# Patient Record
Sex: Male | Born: 2000 | Race: White | Hispanic: No | Marital: Single | Smoking: Current every day smoker
Health system: Southern US, Community
[De-identification: ages and names within clinical notes are randomized; demographics above are authoritative.]

## PROBLEM LIST (undated history)

## (undated) DIAGNOSIS — J45909 Unspecified asthma, uncomplicated: Secondary | ICD-10-CM

---
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
## 2019-05-17 ENCOUNTER — Other Ambulatory Visit: Payer: Self-pay

## 2019-05-17 ENCOUNTER — Emergency Department: Payer: Medicaid Other

## 2019-05-17 ENCOUNTER — Encounter: Payer: Self-pay | Admitting: Emergency Medicine

## 2019-05-17 ENCOUNTER — Emergency Department
Admission: EM | Admit: 2019-05-17 | Discharge: 2019-05-17 | Disposition: A | Discharge status: Home / Self Care | Payer: Medicaid Other | Attending: Emergency Medicine | Admitting: Emergency Medicine

## 2019-05-17 DIAGNOSIS — M25561 Pain in right knee: Secondary | ICD-10-CM | POA: Diagnosis not present

## 2019-05-17 DIAGNOSIS — M2391 Unspecified internal derangement of right knee: Secondary | ICD-10-CM | POA: Diagnosis not present

## 2019-05-17 MED ORDER — OXYCODONE-ACETAMINOPHEN 5-325 MG PO TABS
2.0000 | ORAL_TABLET | Freq: Once | ORAL | Status: AC
  Administered 2019-05-17: 2 via ORAL
  Filled 2019-05-17: qty 2

## 2019-05-17 MED ORDER — OXYCODONE-ACETAMINOPHEN 5-325 MG PO TABS: 2.0000 | ORAL_TABLET | Freq: Four times a day (QID) | ORAL | 0 refills | Status: DC | PRN

## 2019-05-17 NOTE — ED Provider Notes (Signed)
[facility] Emergency Department Provider Note  ____________________________________________   First MD Initiated Contact with Patient 05/17/19 0505     (approximate)  I have reviewed the triage vital signs and the nursing notes.   HISTORY  Chief Complaint Knee Injury    HPI Kyle Trevino is a 18 y.o. male who denies any chronic medical conditions and denies having any prior traumatic injuries to his legs or knees.  He presents by private vehicle tonight with severe pain in the outside of his right knee.  He says he was lying in bed and sort of had his arms wrapped up around and beneath his knee.  Somehow he twisted his knee and felt a pop and has had exquisite and severe pain since that time.  It is point tender to the outside of the right knee and he feels like he cannot extend it.  He does not believe that the kneecap was out of place.  He assures me that he did not fall or have any sort of traumatic injury, it simply happened because he was twisting his knee with his arm.  He reports the pain is "100 out of 10" and any attempt to move the knee makes it much worse.  He cannot bear weight and he brought with him his own crutches from home.  He did not sustain any other injury.  There is a little bit of swelling but not much.  He has no numbness nor tingling in the leg or foot.         History reviewed. No pertinent past medical history.  There are no active problems to display for this patient.   History reviewed. No pertinent surgical history.  Prior to Admission medications   Medication Sig Start Date End Date Taking? Authorizing Provider  oxyCODONE-acetaminophen (PERCOCET) 5-325 MG tablet Take 2 tablets by mouth every 6 (six) hours as needed for severe pain. 05/17/19   Loleta RoseForbach, Amaria Mundorf, MD    Allergies Patient has no allergy information on record.  No family history on file.  Social History Social History   Tobacco Use  . Smoking status: Not on  file  Substance Use Topics  . Alcohol use: Not on file  . Drug use: Not on file    Review of Systems Constitutional: No fever/chills Respiratory: Denies shortness of breath. Gastrointestinal: No abdominal pain.  No nausea, no vomiting.   Musculoskeletal: Severe pain in right knee on the lateral side of it.  Negative for neck pain.  Negative for back pain. Integumentary: Negative for rash. Neurological: Negative for headaches, focal weakness or numbness.   ____________________________________________   PHYSICAL EXAM:  VITAL SIGNS: ED Triage Vitals  Enc Vitals Group     BP 05/17/19 0048 (!) 162/79     Pulse Rate 05/17/19 0048 84     Resp 05/17/19 0048 18     Temp 05/17/19 0048 98.4 F (36.9 C)     Temp Source 05/17/19 0048 Oral     SpO2 05/17/19 0048 100 %     Weight 05/17/19 0042 90.7 kg (200 lb)     Height 05/17/19 0042 1.778 m (5\' 10" )     Head Circumference --      Peak Flow --      Pain Score 05/17/19 0041 10     Pain Loc --      Pain Edu? --      Excl. in GC? --     Constitutional: Alert and oriented. Well appearing  and in no acute distress but does appear to be in pain. Eyes: Conjunctivae are normal.  Head: Atraumatic. Cardiovascular: Normal rate, regular rhythm. Good peripheral circulation. Respiratory: Normal respiratory effort.   Musculoskeletal: No appreciable swelling of the right knee compared to the left.  There may be a little bit of an effusion on the lateral aspect of the superior part of the knee, but that part is not tender.  He has severe point tenderness to the lateral inferior aspect of the knee joint.  No ecchymosis, no cellulitis or erythema, soft and easily compressible compartments above and below.  No tenderness to palpation of the distal femur or of the proximal tibia.  The patient will not allow extension of the knee; he refuses to extend it actively and he will not allow me to passively extend it more than a little bit.  The patella is in place  and appears normal.  There are no palpable gross deformities. Neurologic:  Normal speech and language. No gross focal neurologic deficits are appreciated.  Skin:  Skin is warm, dry and intact. No rash noted.   ____________________________________________   LABS (all labs ordered are listed, but only abnormal results are displayed)  Labs Reviewed - No data to display ____________________________________________  EKG  None - EKG not ordered by ED physician ____________________________________________  RADIOLOGY I, Hinda Kehr, personally viewed and evaluated these images (plain radiographs) as part of my medical decision making, as well as reviewing the written report by the radiologist.  ED MD interpretation: No fracture or dislocation with no obvious effusions.  Official radiology report(s): Dg Knee 2 Views Right  Result Date: 05/17/2019 CLINICAL DATA:  Right knee pain, unable to bear weight. Unable to extend knee. EXAM: RIGHT KNEE - 1-2 VIEW COMPARISON:  None. FINDINGS: No acute fracture. Patella appears normally located on AP and lateral views. Joint spaces are maintained. No large knee joint effusion. No bony destructive change. IMPRESSION: No fracture or evidence of dislocation. Electronically Signed   By: Keith Rake M.D.   On: 05/17/2019 01:46    ____________________________________________   PROCEDURES   Procedure(s) performed (including Critical Care):  Procedures   ____________________________________________   INITIAL IMPRESSION / MDM / [place] / ED COURSE  As part of my medical decision making, I reviewed the following data within the [place] notes reviewed and incorporated, Radiograph reviewed , Notes from prior ED visits and [place] Controlled Substance Database   Probable internal derangement of the knee, most likely a meniscal tear or lateral collateral ligament tear or sprain.  No evidence of emergent medical  condition at this time and no indication for MRI in the ED.  I try to convince the patient to extend his knee so we can put him in a knee immobilizer but he is unable to do so secondary to the pain.  As an alternative I provided an Ace wrap and went over RICE recommendations, over-the-counter pain medicine, Percocet if absolutely necessary, nonweightbearing status, and close orthopedics follow-up.  He says he understands and agrees.  I have no concerns at this time for knee dislocation and there is no evidence that he has dislocated his patella.  He has very specific point tenderness that seems to be arising from 1 of the lateral ligaments.          ____________________________________________  FINAL CLINICAL IMPRESSION(S) / ED DIAGNOSES  Final diagnoses:  Acute pain of right knee  Internal derangement of right knee  MEDICATIONS GIVEN DURING THIS VISIT:  Medications  oxyCODONE-acetaminophen (PERCOCET/ROXICET) 5-325 MG per tablet 2 tablet (2 tablets Oral Given 05/17/19 0529)     ED Discharge Orders         Ordered    oxyCODONE-acetaminophen (PERCOCET) 5-325 MG tablet  Every 6 hours PRN     05/17/19 0531          *Please note:  Kyle Trevino was evaluated in Emergency Department on 05/17/2019 for the symptoms described in the history of present illness. He was evaluated in the context of the global COVID-19 pandemic, which necessitated consideration that the patient might be at risk for infection with the SARS-CoV-2 virus that causes COVID-19. Institutional protocols and algorithms that pertain to the evaluation of patients at risk for COVID-19 are in a state of rapid change based on information released by regulatory bodies including the CDC and federal and state organizations. These policies and algorithms were followed during the patient's care in the ED.  Some ED evaluations and interventions may be delayed as a result of limited staffing during the pandemic.*  Note:  This  document was prepared using Dragon voice recognition software and may include unintentional dictation errors.   Loleta RoseForbach, Cristen Murcia, MD 05/17/19 [phone]

## 2019-05-17 NOTE — Discharge Instructions (Signed)
As we discussed, your x-rays were reassuring with no evidence of a fracture or dislocation in the knee joint.  However, based on the pain you are experiencing, it is likely that you have an internal injury, such as a torn meniscus or a tear or sprain of the lateral collateral ligament of the right knee.  The initial treatment is RICE (Rest, Ice, Compression, Elevation), as well as over-the-counter medications such as ibuprofen and Tylenol according to label instructions.  Please refer to the included information.  Please do not bear weight on your leg and use the Ace wrap provided and your crutches as much as possible.  Call the office of Dr. Mack Guise first thing in the morning and schedule a follow-up appointment.  Explain you were seen in the emergency department for a knee injury and need a follow-up appointment and they will schedule you the next available appointment.  Return to the emergency department with new or worsening symptoms that concern you.  Take Percocet as prescribed for severe pain. Do not drink alcohol, drive or participate in any other potentially dangerous activities while taking this medication as it may make you sleepy. Do not take this medication with any other sedating medications, either prescription or over-the-counter. If you were prescribed Percocet or Vicodin, do not take these with acetaminophen (Tylenol) as it is already contained within these medications.   This medication is an opiate (or narcotic) pain medication and can be habit forming.  Use it as little as possible to achieve adequate pain control.  Do not use or use it with extreme caution if you have a history of opiate abuse or dependence.  If you are on a pain contract with your primary care doctor or a pain specialist, be sure to let them know you were prescribed this medication today from the [facility] Emergency Department.  This medication is intended for your use only - do not give any to anyone else and  keep it in a secure place where nobody else, especially children, have access to it.  It will also cause or worsen constipation, so you may want to consider taking an over-the-counter stool softener while you are taking this medication.

## 2019-05-17 NOTE — ED Notes (Signed)
Pt father name Osker, phone number [phone].

## 2019-05-17 NOTE — ED Triage Notes (Signed)
Pt states that he thinks he popped his right knee out of place. Pt states he had his knee up and moved it a certain way and that was it, the pain started.

## 2021-06-22 ENCOUNTER — Emergency Department: Payer: Medicaid Other

## 2021-06-22 ENCOUNTER — Emergency Department
Admission: EM | Admit: 2021-06-22 | Discharge: 2021-06-22 | Disposition: A | Discharge status: Home / Self Care | Payer: Medicaid Other | Attending: Emergency Medicine | Admitting: Emergency Medicine

## 2021-06-22 ENCOUNTER — Other Ambulatory Visit: Payer: Self-pay

## 2021-06-22 DIAGNOSIS — R042 Hemoptysis: Secondary | ICD-10-CM | POA: Diagnosis not present

## 2021-06-22 DIAGNOSIS — R0602 Shortness of breath: Secondary | ICD-10-CM | POA: Insufficient documentation

## 2021-06-22 DIAGNOSIS — R0789 Other chest pain: Secondary | ICD-10-CM | POA: Insufficient documentation

## 2021-06-22 LAB — BASIC METABOLIC PANEL
Anion gap: 7 (ref 5–15)
BUN: 8 mg/dL (ref 6–20)
CO2: 27 mmol/L (ref 22–32)
Calcium: 9.5 mg/dL (ref 8.9–10.3)
Chloride: 103 mmol/L (ref 98–111)
Creatinine, Ser: 0.64 mg/dL (ref 0.61–1.24)
GFR, Estimated: >60 mL/min (ref >60)
Glucose, Bld: 91 mg/dL (ref 70–99)
Potassium: 4.1 mmol/L (ref 3.5–5.1)
Sodium: 137 mmol/L (ref 135–145)

## 2021-06-22 LAB — TROPONIN I (HIGH SENSITIVITY)
Troponin I (High Sensitivity): 5 ng/L (ref <18)
Troponin I (High Sensitivity): 3 ng/L (ref <18)

## 2021-06-22 LAB — CBC
HCT: 46.4 % (ref 39.0–52.0)
Hemoglobin: 16.1 g/dL (ref 13.0–17.0)
MCH: 30 pg (ref 26.0–34.0)
MCHC: 34.7 g/dL (ref 30.0–36.0)
MCV: 86.6 fL (ref 80.0–100.0)
Platelets: 229 10*3/uL (ref 150–400)
RBC: 5.36 MIL/uL (ref 4.22–5.81)
RDW: 13.2 % (ref 11.5–15.5)
WBC: 7.2 10*3/uL (ref 4.0–10.5)
nRBC: 0 % (ref 0.0–0.2)

## 2021-06-22 MED ORDER — IOHEXOL 350 MG/ML SOLN
75.0000 mL | Freq: Once | INTRAVENOUS | Status: AC | PRN
  Administered 2021-06-22: 75 mL via INTRAVENOUS

## 2021-06-22 MED ORDER — PREDNISONE 10 MG PO TABS: 10.0000 mg | ORAL_TABLET | Freq: Every day | ORAL | 0 refills | Status: DC

## 2021-06-22 NOTE — ED Provider Notes (Signed)
[facility] Emergency Department Provider Note  Time seen: 9:31 PM  I have reviewed the triage vital signs and the nursing notes.   HISTORY  Chief Complaint Hemoptysis, Shortness of Breath, and Chest Pain   HPI Kyle Trevino is a 20 y.o. male with no past medical history presents to the emergency department for hemoptysis.  Patient states for the past 3 to 4 months he has been coughing up small amounts of blood.  States tonight he coughed up a larger amount of bloody sputum so he came to the emergency department for evaluation.  Patient states the cough is chronic.  Denies any chest pain.  Denies any shortness of breath.   History reviewed. No pertinent past medical history.  There are no problems to display for this patient.   History reviewed. No pertinent surgical history.  Prior to Admission medications   Medication Sig Start Date End Date Taking? Authorizing Provider  oxyCODONE-acetaminophen (PERCOCET) 5-325 MG tablet Take 2 tablets by mouth every 6 (six) hours as needed for severe pain. 05/17/19   Loleta Rose, MD    Not on File  History reviewed. No pertinent family history.  Social History Social History   Tobacco Use   Smoking status: Never   Smokeless tobacco: Never    Review of Systems Constitutional: Negative for fever. Cardiovascular: Negative for chest pain. Respiratory: Negative for shortness of breath.  Positive for cough.  Positive for hemoptysis x3 to 4 months. Gastrointestinal: Negative for abdominal pain, vomiting  Musculoskeletal: Negative for musculoskeletal complaints Neurological: Negative for headache All other ROS negative  ____________________________________________   PHYSICAL EXAM:  VITAL SIGNS: ED Triage Vitals  Enc Vitals Group     BP 06/22/21 1755 132/81     Pulse Rate 06/22/21 1753 63     Resp 06/22/21 1753 18     Temp 06/22/21 1753 98.5 F (36.9 C)     Temp Source 06/22/21 1753 Oral     SpO2  06/22/21 1753 97 %     Weight 06/22/21 1755 185 lb (83.9 kg)     Height 06/22/21 1755 5\' 10"  (1.778 m)     Head Circumference --      Peak Flow --      Pain Score 06/22/21 1754 2     Pain Loc --      Pain Edu? --      Excl. in GC? --    Constitutional: Alert and oriented. Well appearing and in no distress. Eyes: Normal exam ENT      Head: Normocephalic and atraumatic.      Mouth/Throat: Mucous membranes are moist. Cardiovascular: Normal rate, regular rhythm.  Respiratory: Normal respiratory effort without tachypnea nor retractions. Breath sounds are clear  Gastrointestinal: Soft and nontender. No distention.   Musculoskeletal: Nontender with normal range of motion in all extremities. Neurologic:  Normal speech and language. No gross focal neurologic deficits  Skin:  Skin is warm, dry and intact.  Psychiatric: Mood and affect are normal.   ____________________________________________ RADIOLOGY  Chest x-ray shows no acute abnormality.  ____________________________________________   INITIAL IMPRESSION / ASSESSMENT AND PLAN / ED COURSE  Pertinent labs & imaging results that were available during my care of the patient were reviewed by me and considered in my medical decision making (see chart for details).   Patient presents emergency department for intermittent hemoptysis x3 to 4 months.  Currently the patient appears well, no distress.  States the cough is longstanding.  Denies any fever.  Denies any chest pain.  Denies feeling short of breath.  Patient's work-up thus far is reassuring including lab work, negative troponin.  Normal white blood cell count.  Normal H&H.  Chest x-ray shows no acute findings.  However given the patient's 3 to 4 months of hemoptysis we will obtain a CTA of the chest to further evaluate.  Patient agreeable to plan of care.  CT scan is negative for acute abnormality.  No PE.  Patient appears well.  Repeat troponin is negative.  We will discharge the  patient with prednisone taper for the next 10 days.  Patient agreeable to plan of care.  Kyle Trevino was evaluated in Emergency Department on 06/22/2021 for the symptoms described in the history of present illness. He was evaluated in the context of the global COVID-19 pandemic, which necessitated consideration that the patient might be at risk for infection with the SARS-CoV-2 virus that causes COVID-19. Institutional protocols and algorithms that pertain to the evaluation of patients at risk for COVID-19 are in a state of rapid change based on information released by regulatory bodies including the CDC and federal and state organizations. These policies and algorithms were followed during the patient's care in the ED.  ____________________________________________   FINAL CLINICAL IMPRESSION(S) / ED DIAGNOSES  Hemoptysis   Minna Antis, MD 06/22/21 2229

## 2021-06-22 NOTE — Discharge Instructions (Signed)
Please take your steroids for the entire course.  Please follow-up with your primary care doctor for recheck/reevaluation.  Return to the emergency department for any increase in the amount of bloody sputum, any shortness of breath, chest pain or any other symptom personally concerning to yourself.

## 2021-06-22 NOTE — ED Triage Notes (Signed)
PT c/o blood in cough, CP w/ anxiety, and SOB x2 months. Pt states blood has been scant but this morning the blood was more than usual. Pt is AOX4, NAD noted. Breathing is even and unlabored.

## 2022-04-15 ENCOUNTER — Encounter: Payer: Self-pay | Admitting: Emergency Medicine

## 2022-04-15 ENCOUNTER — Other Ambulatory Visit: Payer: Self-pay

## 2022-04-15 ENCOUNTER — Emergency Department
Admission: EM | Admit: 2022-04-15 | Discharge: 2022-04-16 | Disposition: A | Discharge status: Home / Self Care | Payer: Medicaid Other | Attending: Emergency Medicine | Admitting: Emergency Medicine

## 2022-04-15 DIAGNOSIS — J45909 Unspecified asthma, uncomplicated: Secondary | ICD-10-CM | POA: Diagnosis not present

## 2022-04-15 DIAGNOSIS — K0889 Other specified disorders of teeth and supporting structures: Secondary | ICD-10-CM | POA: Diagnosis not present

## 2022-04-15 HISTORY — DX: Unspecified asthma, uncomplicated: J45.909

## 2022-04-15 NOTE — ED Triage Notes (Signed)
Patient ambulatory to triage with steady gait, without difficulty or distress noted; pt reports left sided upper and lower dental pain x 3-4 days

## 2022-04-16 MED ORDER — LIDOCAINE VISCOUS HCL 2 % MT SOLN
15.0000 mL | Freq: Once | OROMUCOSAL | Status: AC
  Administered 2022-04-16: 15 mL via OROMUCOSAL
  Filled 2022-04-16: qty 15

## 2022-04-16 MED ORDER — IBUPROFEN 800 MG PO TABS
800.0000 mg | ORAL_TABLET | Freq: Once | ORAL | Status: AC
  Administered 2022-04-16: 800 mg via ORAL
  Filled 2022-04-16: qty 1

## 2022-04-16 MED ORDER — IBUPROFEN 800 MG PO TABS: 800.0000 mg | ORAL_TABLET | Freq: Three times a day (TID) | ORAL | 0 refills | Status: DC | PRN

## 2022-04-16 MED ORDER — OXYCODONE-ACETAMINOPHEN 5-325 MG PO TABS
1.0000 | ORAL_TABLET | Freq: Once | ORAL | Status: AC
  Administered 2022-04-16: 1 via ORAL
  Filled 2022-04-16: qty 1

## 2022-04-16 MED ORDER — LIDOCAINE VISCOUS HCL 2 % MT SOLN: 15.0000 mL | OROMUCOSAL | 0 refills | Status: DC | PRN

## 2022-04-16 NOTE — ED Provider Notes (Signed)
[facility] Provider Note    Event Date/Time   First MD Initiated Contact with Patient 04/16/22 [phone]     (approximate)   History   Dental Pain   HPI  Kyle Trevino is a 21 y.o. male significant past medical history who presents for evaluation of dental pain.  Patient reports 3 to 4 days of progressively worsening pain.  Has been taking ibuprofen and using some benzocaine topical gel.  This evening he could not sleep due to the severity of the pain.  He does have an appointment with his dentist in a couple of days.  He denies trismus or fever     Past Medical History:  Diagnosis Date   Asthma     History reviewed. No pertinent surgical history.   Physical Exam   Triage Vital Signs: ED Triage Vitals  Enc Vitals Group     BP 04/16/22 0002 (!) 144/94     Pulse Rate 04/16/22 0002 71     Resp 04/16/22 0002 19     Temp 04/16/22 0002 98.4 F (36.9 C)     Temp Source 04/16/22 0002 Oral     SpO2 04/16/22 0002 97 %     Weight 04/15/22 2348 200 lb (90.7 kg)     Height 04/15/22 2348 5\' 9"  (1.753 m)     Head Circumference --      Peak Flow --      Pain Score 04/15/22 2347 10     Pain Loc --      Pain Edu? --      Excl. in GC? --     Most recent vital signs: Vitals:   04/16/22 0002  BP: (!) 144/94  Pulse: 71  Resp: 19  Temp: 98.4 F (36.9 C)  SpO2: 97%     Constitutional: Alert and oriented. Well appearing and in no apparent distress. HEENT:      Head: Normocephalic and atraumatic.         Eyes: Conjunctivae are normal. Sclera is non-icteric.       Mouth/Throat: Mucous membranes are moist. L lower wisdom tooth is coming through, no cavities, good dentition otherwise.  Floor of the mouth is soft, no trismus      Neck: Supple with no signs of meningismus. Cardiovascular: Regular rate and rhythm.  Respiratory: Normal respiratory effort.  Neurologic: Normal speech and language. Face is symmetric. Moving all extremities. No gross focal  neurologic deficits are appreciated. Skin: Skin is warm, dry and intact. No rash noted. Psychiatric: Mood and affect are normal. Speech and behavior are normal.  ED Results / Procedures / Treatments   Labs (all labs ordered are listed, but only abnormal results are displayed) Labs Reviewed - No data to display   EKG  none   RADIOLOGY none   PROCEDURES:  Critical Care performed: No  Procedures    IMPRESSION / MDM / ASSESSMENT AND PLAN / ED COURSE  I reviewed the triage vital signs and the nursing notes.  21 y.o. male significant past medical history who presents for evaluation of dental pain.  This pain is coming from his left lower wisdom tooth which is coming out. No cavities, no abscess, no Ludewig's angina.  Will prescribe 800 mg of ibuprofen and topical viscous lidocaine.  We will give him one-time dose of Percocet here for pain.  Recommended follow-up with his dentist  MEDICATIONS GIVEN IN ED: Medications  lidocaine (XYLOCAINE) 2 % viscous mouth solution 15 mL (has no administration  in time range)  ibuprofen (ADVIL) tablet 800 mg (has no administration in time range)  oxyCODONE-acetaminophen (PERCOCET/ROXICET) 5-325 MG per tablet 1 tablet (has no administration in time range)    EMR reviewed I reviewed patient's [place] controlled database with a score of 0.  No other records available.    FINAL CLINICAL IMPRESSION(S) / ED DIAGNOSES   Final diagnoses:  Pain, dental     Rx / DC Orders   ED Discharge Orders          Ordered    ibuprofen (ADVIL) 800 MG tablet  Every 8 hours PRN        04/16/22 0048    lidocaine (XYLOCAINE) 2 % solution  As needed        04/16/22 0048             Note:  This document was prepared using Dragon voice recognition software and may include unintentional dictation errors.   Please note:  Patient was evaluated in Emergency Department today for the symptoms described in the history of present illness. Patient was evaluated  in the context of the global COVID-19 pandemic, which necessitated consideration that the patient might be at risk for infection with the SARS-CoV-2 virus that causes COVID-19. Institutional protocols and algorithms that pertain to the evaluation of patients at risk for COVID-19 are in a state of rapid change based on information released by regulatory bodies including the CDC and federal and state organizations. These policies and algorithms were followed during the patient's care in the ED.  Some ED evaluations and interventions may be delayed as a result of limited staffing during the pandemic.       Don Perking, [place], MD 04/16/22 [phone]

## 2022-04-16 NOTE — ED Notes (Signed)
Patient stated that he has had this left sided dental pain for the past few days that is radiating into upper face. Patient believes he still has wisdom teeth and has a dentist appt in a couple days

## 2022-04-16 NOTE — Discharge Instructions (Signed)
OPTIONS FOR DENTAL FOLLOW UP CARE ° °[place] [facility] and [facility] - Local Safety Net Dental Clinics °http://www.ncdhhs.gov/dph/oralhealth/services/safetynetclinics.htm °  °[facility] (336-562-3123) ° °[facility] [place] (919-933-9087) ° °[facility] (919-663-1744 ext [phone]) ° °[facility] ([phone]) ° °[facility] ([phone]) °This clinic caters to the indigent population and is on a lottery system. °Location: °[facility], [address], [place] °Clinic Hours: °Wednesdays from 6pm - 9pm, patients seen by a lottery system. °For dates, call or go to www.med.[facility].edu/[facility]/patients/Dental-[facility] °Services: °Cleanings, fillings and simple extractions. °Payment Options: °DENTAL WORK IS FREE OF CHARGE. Bring proof of income or support. °Best way to get seen: °Arrive at 5:15 pm - this is a lottery, NOT first come/first serve, so arriving earlier will not increase your chances of being seen. °  °  °[facility] °919-537-3737 °Select option 1 for emergencies °  °Location: °[facility], [address], [place] °Clinic Hours: °No walk-ins accepted - call the day before to schedule an appointment. °Check in times are 9:30 am and 1:30 pm. °Services: °Simple extractions, temporary fillings, pulpectomy/pulp debridement, uncomplicated abscess drainage. °Payment Options: °PAYMENT IS DUE AT THE TIME OF SERVICE.  Fee is usually $100-200, additional surgical procedures (e.g. abscess drainage) may be extra. °Cash, checks, Visa/MasterCard accepted.  Can file Medicaid if patient is covered for dental - patient should call case worker to check. °No discount for [facility] patients. °Best way to get seen: °MUST call the day before and get onto the schedule. Can usually be seen the next 1-2 days. No walk-ins accepted. °  °  °[facility] °[phone] °   °Location: °[facility], [address], [place] °Clinic Hours: °M, W, Th, F 8am or 1:30pm, Tues 9a or 1:30 - first come/first served. °Services: °Simple extractions, temporary fillings, uncomplicated abscess drainage.  You do not need to be an [place] resident. °Payment Options: °PAYMENT IS DUE AT THE TIME OF SERVICE. °Dental insurance, otherwise sliding scale - bring proof of income or support. °Depending on income and treatment needed, cost is usually $50-200. °Best way to get seen: °Arrive early as it is first come/first served. °  °  °[facility] °[phone] °  °Location: °[address] °Clinic Hours: °Mon-Thu 8a-5p °Services: °Most basic dental services including extractions and fillings. °Payment Options: °PAYMENT IS DUE AT THE TIME OF SERVICE. °Sliding scale, up to 50% off - bring proof if income or support. °Medicaid with dental option accepted. °Best way to get seen: °Call to schedule an appointment, can usually be seen within 2 weeks OR they will try to see walk-ins - show up at 8a or 2p (you may have to wait). °  °  °[facility] °[phone] °[place] RESIDENTS ONLY °  °Location: °[facility], [address], [place], [place] [address] °Clinic Hours: By appointment only. °Monday - Thursday 8am-5pm, Friday 8am-12pm °Services: Cleanings, fillings, extractions. °Payment Options: °PAYMENT IS DUE AT THE TIME OF SERVICE. °Cash, Visa or MasterCard. Sliding scale - $30 minimum per service. °Best way to get seen: °Come in to office, complete packet and make an appointment - need proof of income °or support monies for each household member and proof of [place] residence. °Usually takes about a month to get in. °  °  °[facility] °[phone] °  °Location: °[address]   [place] °Clinic Hours: Walk-in Urgent Care Dental Services are offered Monday-Friday  mornings only. °The numbers of emergencies accepted daily is limited to the number of °providers available. °Maximum 15 - Mondays, Wednesdays & Thursdays °Maximum 10 - Tuesdays & Fridays °Services: °You do not need to be a [place] resident to be seen for a dental emergency. °Emergencies are defined as pain, swelling, abnormal bleeding, or dental trauma. Walkins will receive x-rays if needed. °NOTE: Dental cleaning is not an emergency. °Payment Options: °PAYMENT IS DUE AT THE TIME OF SERVICE. °Minimum co-pay is $40.00 for uninsured patients. °Minimum co-pay is $3.00 for Medicaid with dental coverage. °Dental Insurance is accepted and must be presented at time of visit. °Medicare does not cover dental. °Forms of payment: Cash, credit card, checks. °Best way to get seen: °If not previously registered with the clinic, walk-in dental registration begins at 7:15 am and is on a first come/first serve basis. °If previously registered with the clinic, call to make an appointment. °  °  °[facility] °[phone] °[place] RESIDENTS ONLY °  °Location: °[address], [place], [place] °Clinic Hours: °Mon-Thu 10a-2p °Services: Extractions only! °Payment Options: °FREE (donations accepted) - bring proof of income or support °Best way to get seen: °Call and schedule an appointment OR come at 8am on the 1st Monday of every month (except for holidays) when it is first come/first served. °  °  °[facility] °[phone] °  °Location: °[address], [place] °Clinic Hours: °Friday mornings °Services, Payment Options, Best way to get seen: °Call for info °

## 2022-12-31 IMAGING — CT CT ANGIO CHEST
2 of 6 series · 18 of 46 positions shown · IV contrast (APPLIED)
Comparison: None.

CLINICAL DATA: Hemoptysis.

EXAM:
CT ANGIOGRAPHY CHEST WITH CONTRAST
TECHNIQUE: Multidetector CT imaging of the chest was performed using the
standard protocol during bolus administration of intravenous
contrast. Multiplanar CT image reconstructions and MIPs were
obtained to evaluate the vascular anatomy.
CONTRAST:  75mL OMNIPAQUE IOHEXOL 350 MG/ML SOLN

[Series 5: thins · axial · 0.76mm/px · z∈[-57,+215]mm · 15 of 371 slices shown]
[im 16/371  lung]
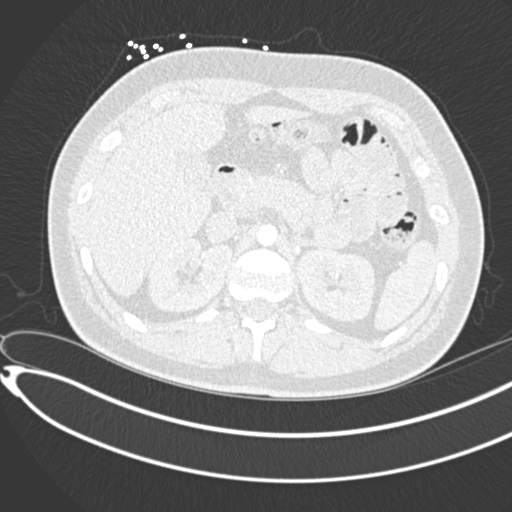
[im 47/371  soft-tissue]
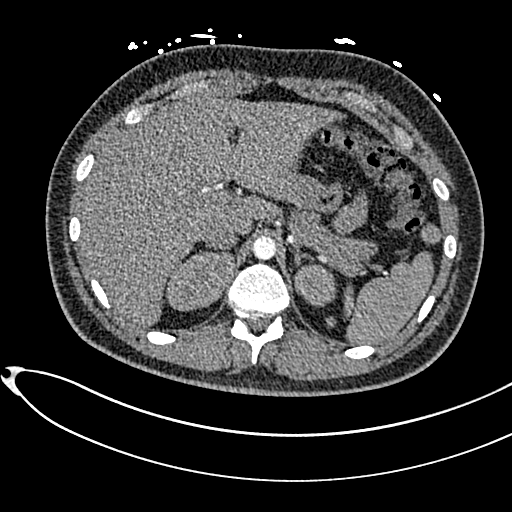
[im 62/371  lung]
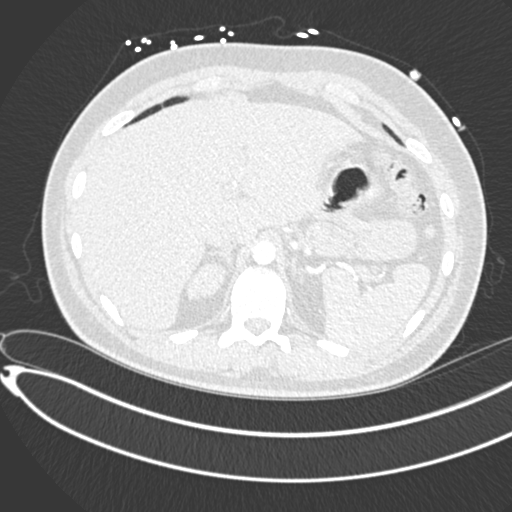
[im 93/371  soft-tissue]
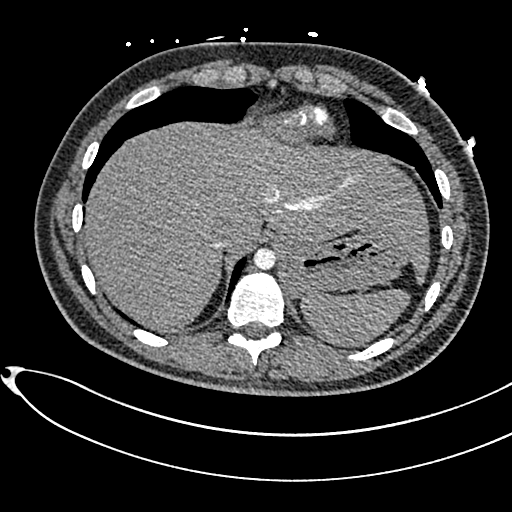
[im 108/371  lung]
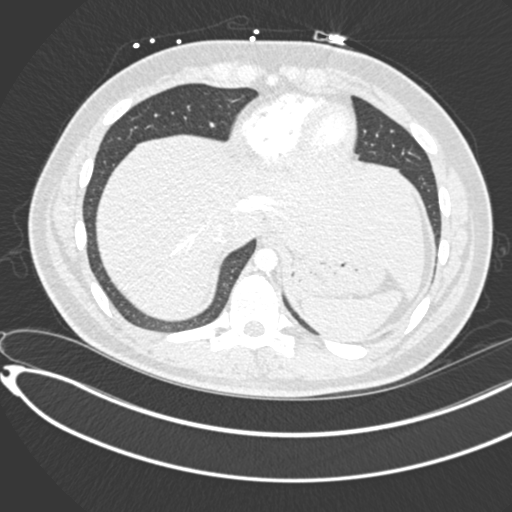
[im 139/371  soft-tissue]
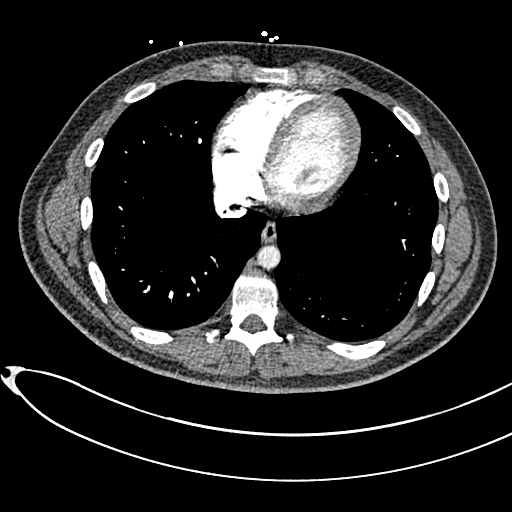
[im 155/371  lung]
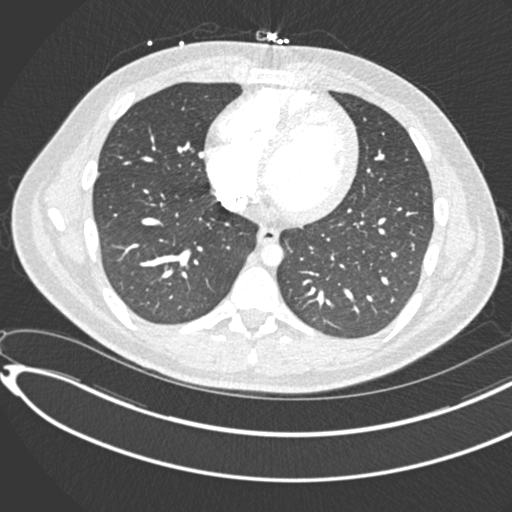
[im 186/371  soft-tissue]
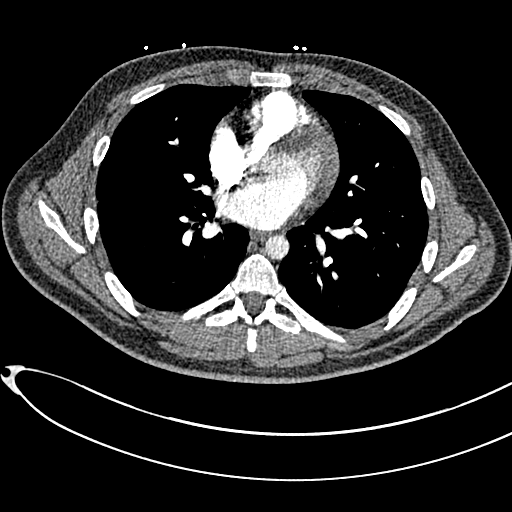
[im 216/371  lung]
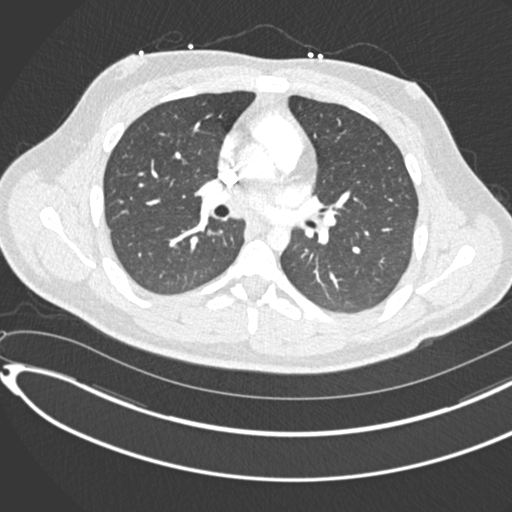
[im 232/371  soft-tissue]
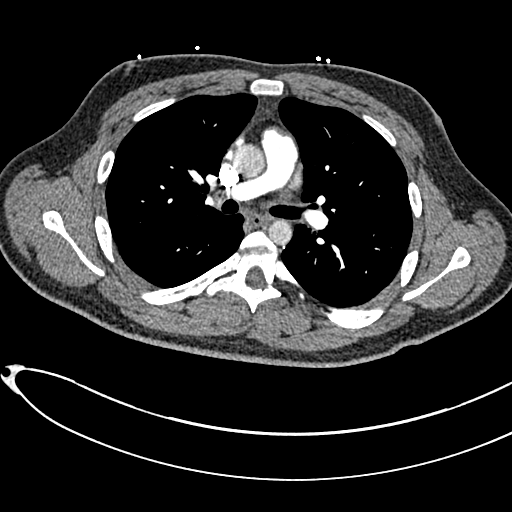
[im 263/371  lung]
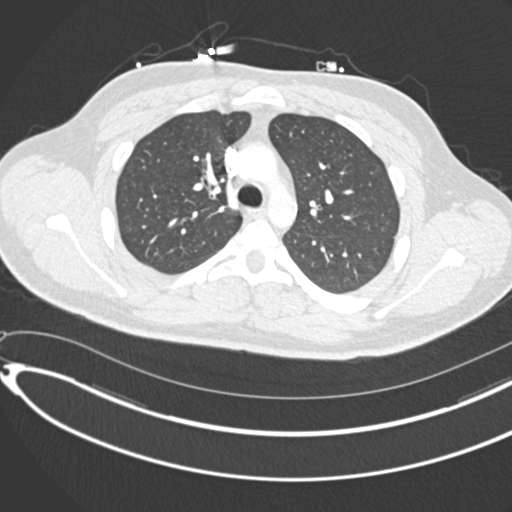
[im 278/371  soft-tissue]
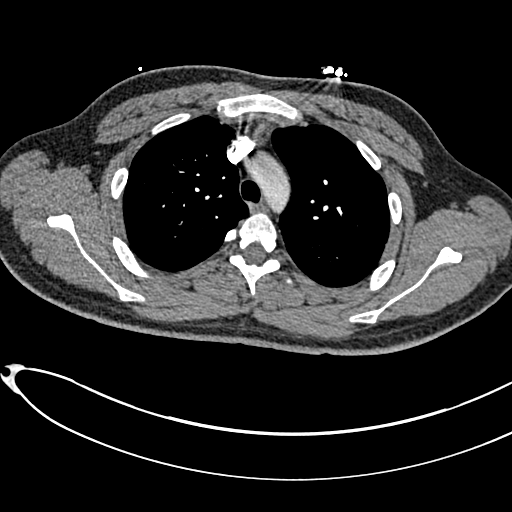
[im 309/371  lung]
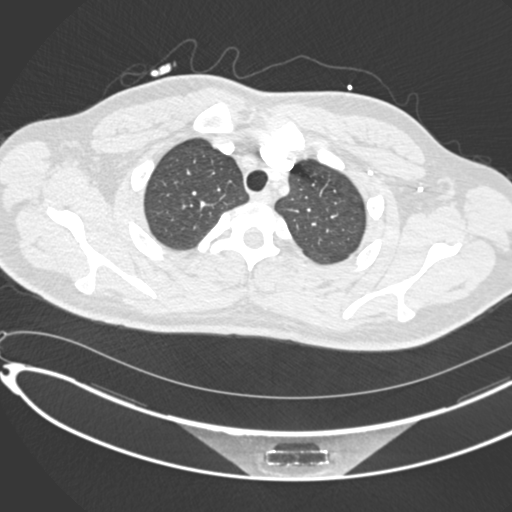
[im 324/371  soft-tissue]
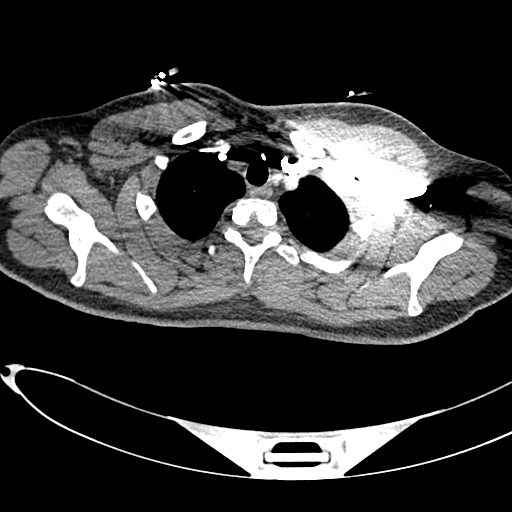
[im 355/371  lung]
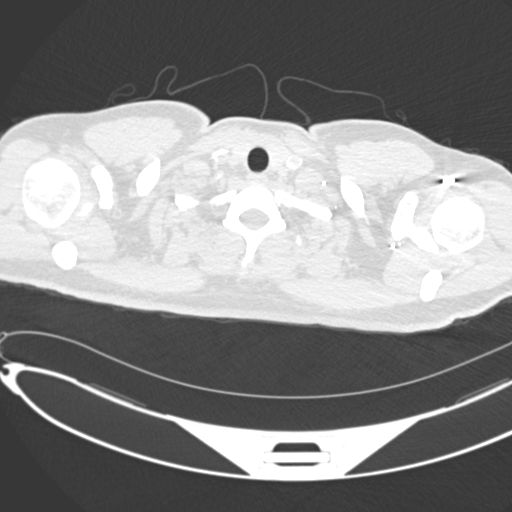

[Series 7: coronal mpr · coronal · 0.58mm/px · 3 of 104 slices shown]
[im 26/104  soft-tissue]
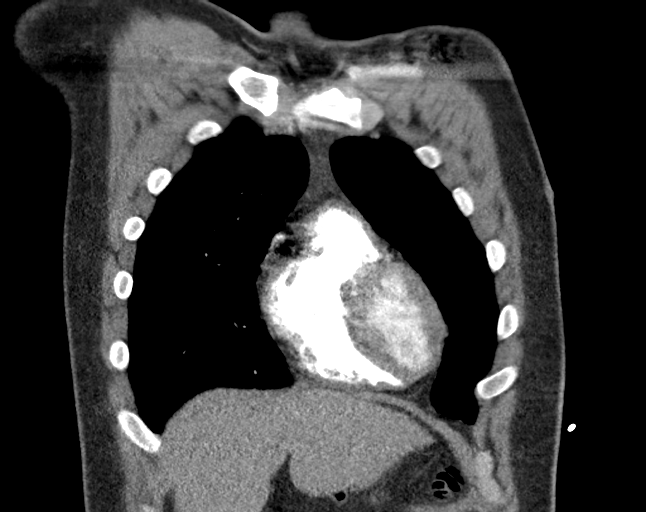
[im 52/104  soft-tissue]
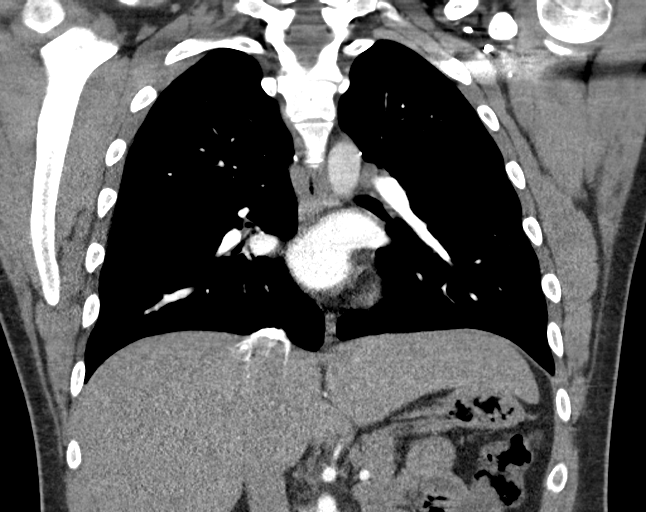
[im 78/104  soft-tissue]
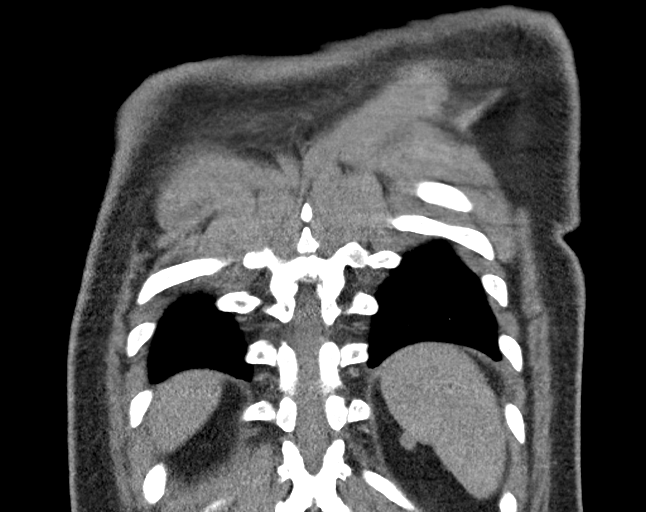

[18 of 46 positions shown; findings below may reference images not displayed]

FINDINGS: Cardiovascular: Satisfactory opacification of the pulmonary arteries
to the segmental level. No evidence of pulmonary embolism. Normal
heart size. No pericardial effusion.

Mediastinum/Nodes: No enlarged mediastinal, hilar, or axillary lymph
nodes. Thyroid gland, trachea, and esophagus demonstrate no
significant findings.

Lungs/Pleura: Lungs are clear. No pleural effusion or pneumothorax.

Upper Abdomen: No acute abnormality.

Musculoskeletal: No chest wall abnormality. No acute or significant
osseous findings.

Review of the MIP images confirms the above findings.
IMPRESSION: Negative examination for pulmonary embolism or acute cardiopulmonary
disease.

## 2023-03-12 ENCOUNTER — Other Ambulatory Visit: Payer: Self-pay

## 2023-03-12 ENCOUNTER — Emergency Department
Admission: EM | Admit: 2023-03-12 | Discharge: 2023-03-12 | Disposition: A | Discharge status: Home / Self Care | Payer: Medicaid Other | Attending: Emergency Medicine | Admitting: Emergency Medicine

## 2023-03-12 ENCOUNTER — Emergency Department: Payer: Medicaid Other

## 2023-03-12 DIAGNOSIS — R1031 Right lower quadrant pain: Secondary | ICD-10-CM | POA: Diagnosis present

## 2023-03-12 DIAGNOSIS — R001 Bradycardia, unspecified: Secondary | ICD-10-CM | POA: Diagnosis not present

## 2023-03-12 LAB — URINALYSIS, ROUTINE W REFLEX MICROSCOPIC
Bilirubin Urine: NEGATIVE
Glucose, UA: NEGATIVE mg/dL
Hgb urine dipstick: NEGATIVE
Ketones, ur: NEGATIVE mg/dL
Leukocytes,Ua: NEGATIVE
Nitrite: NEGATIVE
Protein, ur: NEGATIVE mg/dL
Specific Gravity, Urine: 1.009 (ref 1.005–1.030)
pH: 6 (ref 5.0–8.0)

## 2023-03-12 LAB — COMPREHENSIVE METABOLIC PANEL
ALT: 18 U/L (ref 0–44)
AST: 19 U/L (ref 15–41)
Albumin: 4.6 g/dL (ref 3.5–5.0)
Alkaline Phosphatase: 66 U/L (ref 38–126)
Anion gap: 9 (ref 5–15)
BUN: 11 mg/dL (ref 6–20)
CO2: 27 mmol/L (ref 22–32)
Calcium: 9.4 mg/dL (ref 8.9–10.3)
Chloride: 102 mmol/L (ref 98–111)
Creatinine, Ser: 0.81 mg/dL (ref 0.61–1.24)
GFR, Estimated: >60 mL/min (ref >60)
Glucose, Bld: 120 mg/dL — ABNORMAL HIGH (ref 70–99)
Potassium: 3.6 mmol/L (ref 3.5–5.1)
Sodium: 138 mmol/L (ref 135–145)
Total Bilirubin: 0.6 mg/dL (ref 0.3–1.2)
Total Protein: 7.4 g/dL (ref 6.5–8.1)

## 2023-03-12 LAB — CBC
HCT: 44.9 % (ref 39.0–52.0)
Hemoglobin: 15.2 g/dL (ref 13.0–17.0)
MCH: 30.4 pg (ref 26.0–34.0)
MCHC: 33.9 g/dL (ref 30.0–36.0)
MCV: 89.8 fL (ref 80.0–100.0)
Platelets: 243 10*3/uL (ref 150–400)
RBC: 5 MIL/uL (ref 4.22–5.81)
RDW: 13.2 % (ref 11.5–15.5)
WBC: 8 10*3/uL (ref 4.0–10.5)
nRBC: 0 % (ref 0.0–0.2)

## 2023-03-12 LAB — LIPASE, BLOOD: Lipase: 33 U/L (ref 11–51)

## 2023-03-12 MED ORDER — IOHEXOL 300 MG/ML  SOLN
100.0000 mL | Freq: Once | INTRAMUSCULAR | Status: AC | PRN
  Administered 2023-03-12: 100 mL via INTRAVENOUS

## 2023-03-12 NOTE — ED Triage Notes (Signed)
C/O RLQ abdominal pain.  Has had pain ongoing x 2 weeks. SEnt to ED from [facility] for evaluation.  AAOx3.  Skin warm and dry.

## 2023-03-12 NOTE — ED Provider Notes (Signed)
[facility] Provider Note  Patient Contact: 6:17 PM (approximate)   History   Abdominal Pain   HPI  Kyle Trevino is a 22 y.o. male presents to the emergency department with right lower quadrant abdominal pain for the past 2 weeks without associated nausea, vomiting or fever.  No dysuria or hematuria.  No prior history of nephrolithiasis.  Patient reports that last bowel movement was today with no recent history of constipation.  Patient was referred from urgent care for further care and evaluation for possible appendicitis.  No prior history of inguinal hernias.      Physical Exam   Triage Vital Signs: ED Triage Vitals  Enc Vitals Group     BP 03/12/23 1603 136/70     Pulse Rate 03/12/23 1603 (!) 56     Resp 03/12/23 1603 18     Temp 03/12/23 1603 97.8 F (36.6 C)     Temp Source 03/12/23 1603 Oral     SpO2 03/12/23 1603 97 %     Weight 03/12/23 1615 199 lb 15.3 oz (90.7 kg)     Height 03/12/23 1615 5\' 9"  (1.753 m)     Head Circumference --      Peak Flow --      Pain Score 03/12/23 1615 0     Pain Loc --      Pain Edu? --      Excl. in GC? --     Most recent vital signs: Vitals:   03/12/23 1603 03/12/23 1938  BP: 136/70 130/76  Pulse: (!) 56 (!) 52  Resp: 18 14  Temp: 97.8 F (36.6 C) 98.4 F (36.9 C)  SpO2: 97% 100%     General: Alert and in no acute distress. Eyes:  PERRL. EOMI. Head: No acute traumatic findings ENT:      Nose: No congestion/rhinnorhea.      Mouth/Throat: Mucous membranes are moist. Neck: No stridor. No cervical spine tenderness to palpation. Cardiovascular:  Good peripheral perfusion Respiratory: Normal respiratory effort without tachypnea or retractions. Lungs CTAB. Good air entry to the bases with no decreased or absent breath sounds. Gastrointestinal: Bowel sounds 4 quadrants. Soft and nontender to palpation. No guarding or rigidity. No palpable masses. No distention. No CVA tenderness. Musculoskeletal:  Full range of motion to all extremities.  Neurologic:  No gross focal neurologic deficits are appreciated.  Skin:   No rash noted Other:   ED Results / Procedures / Treatments   Labs (all labs ordered are listed, but only abnormal results are displayed) Labs Reviewed  COMPREHENSIVE METABOLIC PANEL - Abnormal; Notable for the following components:      Result Value   Glucose, Bld 120 (*)    All other components within normal limits  URINALYSIS, ROUTINE W REFLEX MICROSCOPIC - Abnormal; Notable for the following components:   Color, Urine YELLOW (*)    APPearance CLEAR (*)    All other components within normal limits  LIPASE, BLOOD  CBC        RADIOLOGY  I personally viewed and evaluated these images as part of my medical decision making, as well as reviewing the written report by the radiologist.  ED Provider Interpretation: CT abdomen pelvis shows no acute abnormality.   PROCEDURES:  Critical Care performed: No  Procedures   MEDICATIONS ORDERED IN ED: Medications  iohexol (OMNIPAQUE) 300 MG/ML solution 100 mL (100 mLs Intravenous Contrast Given 03/12/23 1839)     IMPRESSION / MDM / ASSESSMENT AND PLAN /  ED COURSE  I reviewed the triage vital signs and the nursing notes.                              Assessment and plan Abdominal pain 22 year old male presents to the emergency department with right lower quadrant abdominal pain for the past 2 weeks.  Patient was bradycardic at triage but vital signs were otherwise reassuring.  On exam, abdomen was soft and nontender without guarding.  Differential diagnosis includes appendicitis, mesenteric lymphadenitis, constipation, nephrolithiasis...   CBC, CMP and lipase reassuring.  Urinalysis shows no signs of UTI.  CT abdomen pelvis unremarkable.  Reassurance was given.  Return precautions were given to return with new or worsening symptoms.   FINAL CLINICAL IMPRESSION(S) / ED DIAGNOSES   Final diagnoses:  Right  lower quadrant abdominal pain     Rx / DC Orders   ED Discharge Orders     None        Note:  This document was prepared using Dragon voice recognition software and may include unintentional dictation errors.   Pia Mau Rockwell, PA-C 03/12/23 2251    Pilar Jarvis, MD 03/18/23 [phone]

## 2023-03-12 NOTE — Discharge Instructions (Signed)
Your workup in the emergency department was reassuring. There was no acute abnormalities visualized on the CT of your abdomen and pelvis. In particular, no signs of appendicitis.

## 2023-10-21 ENCOUNTER — Emergency Department: Payer: Medicaid Other

## 2023-10-21 ENCOUNTER — Emergency Department
Admission: EM | Admit: 2023-10-21 | Discharge: 2023-10-21 | Disposition: A | Discharge status: Home / Self Care | Payer: Medicaid Other | Attending: Emergency Medicine | Admitting: Emergency Medicine

## 2023-10-21 ENCOUNTER — Other Ambulatory Visit: Payer: Self-pay

## 2023-10-21 DIAGNOSIS — X501XXA Overexertion from prolonged static or awkward postures, initial encounter: Secondary | ICD-10-CM | POA: Diagnosis not present

## 2023-10-21 DIAGNOSIS — S46912A Strain of unspecified muscle, fascia and tendon at shoulder and upper arm level, left arm, initial encounter: Secondary | ICD-10-CM | POA: Diagnosis not present

## 2023-10-21 DIAGNOSIS — S4992XA Unspecified injury of left shoulder and upper arm, initial encounter: Secondary | ICD-10-CM | POA: Diagnosis present

## 2023-10-21 MED ORDER — IBUPROFEN 800 MG PO TABS: 800.0000 mg | ORAL_TABLET | Freq: Three times a day (TID) | ORAL | 0 refills | Status: AC | PRN

## 2023-10-21 MED ORDER — CYCLOBENZAPRINE HCL 5 MG PO TABS: 5.0000 mg | ORAL_TABLET | Freq: Three times a day (TID) | ORAL | 0 refills | Status: AC | PRN

## 2023-10-21 NOTE — ED Triage Notes (Signed)
Pt sts that he did a hand stand yesterday and felt something tear/ pop in his shoulder. Pt shoulder is not deformed in any means however pt is not able to have full ROM of the shoulder. Pt is not able to abduct his arm.

## 2023-10-21 NOTE — ED Notes (Signed)
See triage notes. Patient c/o left shoulder pain after doing a hand stand last night. Patient doesn't have full range of movement.

## 2023-10-21 NOTE — Discharge Instructions (Addendum)
Your exam and XR are consistent with a shoulder strain. Take the prescription meds as directed. Apply ice and moist heat to reduce symptoms. Follow-up with [facility] for ongoing symptoms.

## 2023-10-21 NOTE — ED Provider Notes (Signed)
[facility] Emergency Department Provider Note     Event Date/Time   First MD Initiated Contact with Patient 10/21/23 1524     (approximate)   History   Shoulder Pain   HPI  Kyle Trevino is a 22 y.o. male presents to the ED for evaluation of Left shoulder pain with onset yesterday.  Patient was taken to perform a handstand, when he apparently collapsed under his weight, landing on his left shoulder.  Denies any head injury or LOC.  He reports an immediate strain to the shoulder at the time of the incident.  He denies any grip changes or distal paresthesias.  No chest pain shortness of breath, or the injuries reported at this time who presents to the ED today for evaluation of left shoulder pain and stiffness.  No history of chronic or ongoing shoulder problems.  Physical Exam   Triage Vital Signs: ED Triage Vitals  Encounter Vitals Group     BP 10/21/23 1522 (!) 146/79     Systolic BP Percentile --      Diastolic BP Percentile --      Pulse Rate 10/21/23 1522 72     Resp 10/21/23 1522 19     Temp 10/21/23 1522 98.1 F (36.7 C)     Temp Source 10/21/23 1522 Oral     SpO2 10/21/23 1522 99 %     Weight 10/21/23 1523 170 lb (77.1 kg)     Height 10/21/23 1523 5\' 9"  (1.753 m)     Head Circumference --      Peak Flow --      Pain Score 10/21/23 1523 7     Pain Loc --      Pain Education --      Exclude from Growth Chart --     Most recent vital signs: Vitals:   10/21/23 1522  BP: (!) 146/79  Pulse: 72  Resp: 19  Temp: 98.1 F (36.7 C)  SpO2: 99%    General Awake, no distress. NAD HEENT NCAT. PERRL. EOMI. No rhinorrhea. Mucous membranes are moist.  CV:  Good peripheral perfusion.  RESP:  Normal effort.  ABD:  No distention.  MSK:  Left shoulder without obvious deformity, dislocation, or sulcus sign.  Patient with active range of motion on exam.  Normal composite fist distally.  No evidence of internal derangement as he has normal rotator  cuff testing.  Negative empty can sign.  Normal internal/external rotation on exam. NEURO: Cranial nerves II to XII grossly intact.   ED Results / Procedures / Treatments   Labs (all labs ordered are listed, but only abnormal results are displayed) Labs Reviewed - No data to display   EKG   RADIOLOGY   personally viewed and evaluated these images as part of my medical decision making, as well as reviewing the written report by the radiologist.  ED Provider Interpretation: No acute findings  DG Shoulder Left Result Date: 10/21/2023 CLINICAL DATA:  Fall, pain EXAM: LEFT SHOULDER - 2+ VIEW COMPARISON:  None Available. FINDINGS: There is no evidence of fracture or dislocation. There is no evidence of arthropathy or other focal bone abnormality. Soft tissues are unremarkable. IMPRESSION: No fracture or dislocation of the left shoulder. Joint spaces are preserved. Electronically Signed   By: Jearld Lesch M.D.   On: 10/21/2023 16:36     PROCEDURES:  Critical Care performed: No  Procedures   MEDICATIONS ORDERED IN ED: Medications - No data to display  IMPRESSION / MDM / ASSESSMENT AND PLAN / ED COURSE  I reviewed the triage vital signs and the nursing notes.                              Differential diagnosis includes, but is not limited to, strain, shoulder dislocation, shoulder fracture, AC separation, clavicle fracture, rotator cuff tear, tendinitis  Patient's presentation is most consistent with acute complicated illness / injury requiring diagnostic workup.  Patient's diagnosis is consistent with shoulder strain.  No radiologic evidence of any acute fracture or dislocation, based on my interpretation.  Clinically the patient has no evidence of internal derangement to the shoulder.  Active range of motion and full strength testing noted bilaterally.  Symptoms likely consistent with a shoulder sprain or strain.  Patient will be discharged home with prescriptions for  cyclobenzaprine and ibuprofen. Patient is to follow up with [facility] as discussed, as needed or otherwise directed. Patient is given ED precautions to return to the ED for any worsening or new symptoms.   FINAL CLINICAL IMPRESSION(S) / ED DIAGNOSES   Final diagnoses:  Strain of left shoulder, initial encounter     Rx / DC Orders   ED Discharge Orders          Ordered    ibuprofen (ADVIL) 800 MG tablet  Every 8 hours PRN        10/21/23 1649    cyclobenzaprine (FLEXERIL) 5 MG tablet  3 times daily PRN        10/21/23 1649             Note:  This document was prepared using Dragon voice recognition software and may include unintentional dictation errors.    Lissa Hoard, PA-C 10/21/23 1652    Trinna Post, MD 10/21/23 [phone]
